# Patient Record
Sex: Male | Born: 1995 | Race: White | Hispanic: No | Marital: Single | Smoking: Former smoker
Health system: Southern US, Community
[De-identification: ages and names within clinical notes are randomized; demographics above are authoritative.]

---

## 2018-03-01 ENCOUNTER — Encounter (HOSPITAL_COMMUNITY): Payer: Self-pay

## 2018-03-01 ENCOUNTER — Emergency Department (HOSPITAL_COMMUNITY)
Admission: EM | Admit: 2018-03-01 | Discharge: 2018-03-01 | Disposition: A | Discharge status: Home / Self Care | Payer: BC Managed Care – PPO | Attending: Emergency Medicine | Admitting: Emergency Medicine

## 2018-03-01 ENCOUNTER — Encounter (HOSPITAL_COMMUNITY): Payer: Self-pay | Admitting: Family Medicine

## 2018-03-01 ENCOUNTER — Ambulatory Visit (INDEPENDENT_AMBULATORY_CARE_PROVIDER_SITE_OTHER)
Admission: EM | Admit: 2018-03-01 | Discharge: 2018-03-01 | Disposition: A | Discharge status: Home / Self Care | Payer: BC Managed Care – PPO | Source: Home / Self Care | Attending: Family Medicine | Admitting: Family Medicine

## 2018-03-01 ENCOUNTER — Other Ambulatory Visit: Payer: Self-pay

## 2018-03-01 DIAGNOSIS — Z87891 Personal history of nicotine dependence: Secondary | ICD-10-CM | POA: Diagnosis not present

## 2018-03-01 DIAGNOSIS — R1031 Right lower quadrant pain: Secondary | ICD-10-CM

## 2018-03-01 DIAGNOSIS — R1032 Left lower quadrant pain: Secondary | ICD-10-CM

## 2018-03-01 DIAGNOSIS — R197 Diarrhea, unspecified: Secondary | ICD-10-CM | POA: Diagnosis not present

## 2018-03-01 DIAGNOSIS — R112 Nausea with vomiting, unspecified: Secondary | ICD-10-CM | POA: Diagnosis not present

## 2018-03-01 LAB — CBC
HCT: 49.4 % (ref 39.0–52.0)
Hemoglobin: 17.4 g/dL — ABNORMAL HIGH (ref 13.0–17.0)
MCH: 30.2 pg (ref 26.0–34.0)
MCHC: 35.2 g/dL (ref 30.0–36.0)
MCV: 85.8 fL (ref 80.0–100.0)
PLATELETS: 261 10*3/uL (ref 150–400)
RBC: 5.76 MIL/uL (ref 4.22–5.81)
RDW: 12.2 % (ref 11.5–15.5)
WBC: 5.9 10*3/uL (ref 4.0–10.5)
nRBC: 0 % (ref 0.0–0.2)

## 2018-03-01 LAB — COMPREHENSIVE METABOLIC PANEL
ALT: 14 U/L (ref 0–44)
AST: 24 U/L (ref 15–41)
Albumin: 4.2 g/dL (ref 3.5–5.0)
Alkaline Phosphatase: 53 U/L (ref 38–126)
Anion gap: 10 (ref 5–15)
BUN: 16 mg/dL (ref 6–20)
CO2: 21 mmol/L — ABNORMAL LOW (ref 22–32)
Calcium: 9 mg/dL (ref 8.9–10.3)
Chloride: 102 mmol/L (ref 98–111)
Creatinine, Ser: 1.05 mg/dL (ref 0.61–1.24)
GFR calc non Af Amer: >60 mL/min (ref >60)
Glucose, Bld: 97 mg/dL (ref 70–99)
Potassium: 4.5 mmol/L (ref 3.5–5.1)
SODIUM: 133 mmol/L — AB (ref 135–145)
Total Bilirubin: 1.5 mg/dL — ABNORMAL HIGH (ref 0.3–1.2)
Total Protein: 6.6 g/dL (ref 6.5–8.1)

## 2018-03-01 LAB — URINALYSIS, ROUTINE W REFLEX MICROSCOPIC
Bilirubin Urine: NEGATIVE
GLUCOSE, UA: NEGATIVE mg/dL
Hgb urine dipstick: NEGATIVE
Ketones, ur: NEGATIVE mg/dL
LEUKOCYTE UA: NEGATIVE
Nitrite: NEGATIVE
PROTEIN: NEGATIVE mg/dL
Specific Gravity, Urine: 1.016 (ref 1.005–1.030)
pH: 6 (ref 5.0–8.0)

## 2018-03-01 LAB — LIPASE, BLOOD: Lipase: 21 U/L (ref 11–51)

## 2018-03-01 MED ORDER — ONDANSETRON HCL 4 MG/2ML IJ SOLN
4.0000 mg | Freq: Once | INTRAMUSCULAR | Status: AC
  Administered 2018-03-01: 4 mg via INTRAVENOUS
  Filled 2018-03-01: qty 2

## 2018-03-01 MED ORDER — ONDANSETRON 4 MG PO TBDP: 4.0000 mg | ORAL_TABLET | Freq: Three times a day (TID) | ORAL | 0 refills | Status: AC | PRN

## 2018-03-01 MED ORDER — SODIUM CHLORIDE 0.9 % IV BOLUS
1000.0000 mL | Freq: Once | INTRAVENOUS | Status: AC
  Administered 2018-03-01: 1000 mL via INTRAVENOUS

## 2018-03-01 NOTE — ED Triage Notes (Signed)
Pt endorses mid abd pain with n/v/d x 4 days. Has not kept anything down in 3 days. VSS.

## 2018-03-01 NOTE — Discharge Instructions (Addendum)
You have been seen today for nausea, vomiting, and diarrhea. Please read and follow all provided instructions.   1. Medications: zofran for nausea, usual home medications 2. Treatment: rest, drink plenty of fluids 3. Follow Up: Please follow up with your primary doctor in 2 days for discussion of your diagnoses and further evaluation after today's visit; if you do not have a primary care doctor use the resource guide provided to find one; Please return to the ER for any new or worsening symptoms. Please obtain all of your results from medical records or have your doctors office obtain the results - share them with your doctor - you should be seen at your doctors office. Call today to arrange your follow up.   Take medications as prescribed. Please review all of the medicines and only take them if you do not have an allergy to them. Return to the emergency room for worsening condition or new concerning symptoms. Follow up with your regular doctor. If you don't have a regular doctor use one of the numbers below to establish a primary care doctor.  Please be aware that if you are taking birth control pills, taking other prescriptions, ESPECIALLY ANTIBIOTICS may make the birth control ineffective - if this is the case, either do not engage in sexual activity or use alternative methods of birth control such as condoms until you have finished the medicine and your family doctor says it is OK to restart them. If you are on a blood thinner such as COUMADIN, be aware that any other medicine that you take may cause the coumadin to either work too much, or not enough - you should have your coumadin level rechecked in next 7 days if this is the case.  ?  It is also a possibility that you have an allergic reaction to any of the medicines that you have been prescribed - Everybody reacts differently to medications and while MOST people have no trouble with most medicines, you may have a reaction such as nausea, vomiting,  rash, swelling, shortness of breath. If this is the case, please stop taking the medicine immediately and contact your physician.  ?  You should return to the ER if you develop severe or worsening symptoms.   Emergency Department Resource Guide 1) Find a Doctor and Pay Out of Pocket Although you won't have to find out who is covered by your insurance plan, it is a good idea to ask around and get recommendations. You will then need to call the office and see if the doctor you have chosen will accept you as a new patient and what types of options they offer for patients who are self-pay. Some doctors offer discounts or will set up payment plans for their patients who do not have insurance, but you will need to ask so you aren't surprised when you get to your appointment.  2) Contact Your Local Health Department Not all health departments have doctors that can see patients for sick visits, but many do, so it is worth a call to see if yours does. If you don't know where your local health department is, you can check in your phone book. The CDC also has a tool to help you locate your state's health department, and many state websites also have listings of all of their local health departments.  3) Find a Walk-in Clinic If your illness is not likely to be very severe or complicated, you may want to try a walk in clinic. These  are popping up all over the country in pharmacies, drugstores, and shopping centers. They're usually staffed by nurse practitioners or physician assistants that have been trained to treat common illnesses and complaints. They're usually fairly quick and inexpensive. However, if you have serious medical issues or chronic medical problems, these are probably not your best option.  No Primary Care Doctor: Call Health Connect at  [phone] - they can help you locate a primary care doctor that  accepts your insurance, provides certain services, etc. Physician Referral [phone]  Emergency Department Resource Guide 1) Find a Doctor and Pay Out of Pocket Although you won't have to find out who is covered by your insurance plan, it is a good idea to ask around and get recommendations. You will then need to call the office and see if the doctor you have chosen will accept you as a new patient and what types of options they offer for patients who are self-pay. Some doctors offer discounts or will set up payment plans for their patients who do not have insurance, but you will need to ask so you aren't surprised when you get to your appointment.  2) Contact Your Local Health Department Not all health departments have doctors that can see patients for sick visits, but many do, so it is worth a call to see if yours does. If you don't know where your local health department is, you can check in your phone book. The CDC also has a tool to help you locate your state's health department, and many state websites also have listings of all of their local health departments.  3) Find a [facility] If your illness is not likely to be very severe or complicated, you may want to try a walk in clinic. These are popping up all over the country in pharmacies, drugstores, and shopping centers. They're usually staffed by nurse practitioners or physician assistants that have been trained to treat common illnesses and complaints. They're usually fairly quick and inexpensive. However, if you have serious medical issues or chronic medical problems, these are probably not your best option.  No Primary Care Doctor: Call Health Connect at  [phone] - they can help you locate a primary care doctor that  accepts your insurance, provides certain services, etc. Physician Referral Service- [phone]  Chronic Pain Problems: Organization         Address  Phone   Notes  [facility]  [phone] Patients need to be referred by their primary care doctor.    Medication Assistance: Organization         Address  Phone   Notes  [facility] Medication [facility] [address] [place], [place] [address] [phone] --Must be a resident of [facility] -- Must have NO insurance coverage whatsoever (no Medicaid/ Medicare, etc.) -- The pt. MUST have a primary care doctor that directs their care regularly and follows them in the community   [facility]  [phone]   [employer]  [phone]    Agencies that provide inexpensive medical care: Organization         Address  Phone   Notes  [place]  [phone]   [facility]    [phone]   [facility] [place],  [phone]   [place] [address] [place] [phone]   [facility]anned [facility]    (  [phone]   [facility]    [phone]   [facility]  [address], [place] Phone:  [phone], Fax:  [phone] Hours of Operation:  9 am - 6 pm, M-F.  Also accepts Medicaid/Medicare and self-pay.  [facility] for [place] [address], [address], [place] Phone: [phone], Fax: [phone]. Hours of Operation:  8:30 am - 5:30 pm, M-F.  Also accepts Medicaid and self-pay.  [facility] [place] [address], [place] Phone: [phone]   [place], [place], [place] [phone], Ext. [phone] Mondays & Thursdays: 7-9 AM.  First 15 patients are seen on a first come, first serve basis.    [place] Providers:  Organization         Address  Phone   Notes  [facility] [address], [place] [phone] Also accepts self-pay patients.  [facility] [address], [place]  [phone]   [place], [address], [place] [phone]   [facility] [address] [place] [phone]   Lucianne Lei [address], [place]   [phone] Only accepts [place] [facility] [place] patients after they have their name applied to their card.   Self-Pay (no insurance) in [facility]:  Organization         Address  Phone   Notes  Sickle Cell Patients, [facility] [place] [phone]   [facility] [place] [phone]   [facility] [place]  [place], [place], [place] [phone]   [facility]/Dr. Osei-Bonsu  [address], [place] or [address], [place] [phone] Phone number for both [place] and [place] locations is the same.  [facility] and [facility] [address], [place] [phone]   [facility] [address] [place] or [address] [phone]   [facility] [address] [place] [phone], phone; [phone], fax Sees patients 1st and 3rd Saturday of every month.  Must not qualify for public or private insurance (i.e. Medicaid, Medicare, [place] Health Choice, Veterans' Benefits)  Household income should be no more than 200% of the poverty level The clinic cannot treat you if you are pregnant or think you are pregnant  Sexually transmitted diseases are not treated at the clinic.

## 2018-03-01 NOTE — ED Provider Notes (Signed)
[facility]    CSN: [id number] Arrival date & time: 03/01/18  3557     History   Chief Complaint Chief Complaint  Patient presents with  . Abdominal Pain    HPI Adrian Clements is a 23 y.o. male.   Patient is a 23 year old male the presents with 4 days of persistent abdominal pain.  Describes as sharp and cramping at times.  The pain is mostly in the peri-umbilicus and right lower quadrant area.  He has had associated vomiting and diarrhea.  Denies any fever, chills, myalgias.  He has had loss of appetite.  No recent traveling.  No recent sick contacts. LBM PTA.   ROS per HPI      History reviewed. No pertinent past medical history.  There are no active problems to display for this patient.   History reviewed. No pertinent surgical history.     Home Medications    Prior to Admission medications   Not on File    Family History History reviewed. No pertinent family history.  Social History Social History   Tobacco Use  . Smoking status: Former Games developer  . Smokeless tobacco: Never Used  Substance Use Topics  . Alcohol use: Never    Frequency: Never  . Drug use: Yes    Types: Marijuana     Allergies   Patient has no known allergies.   Review of Systems Review of Systems   Physical Exam Triage Vital Signs ED Triage Vitals [03/01/18 0917]  Enc Vitals Group     BP 106/80     Pulse Rate 88     Resp 16     Temp 97.9 F (36.6 C)     Temp Source Oral     SpO2 100 %     Weight      Height      Head Circumference      Peak Flow      Pain Score      Pain Loc      Pain Edu?      Excl. in GC?    No data found.  Updated Vital Signs BP 106/80 (BP Location: Left Arm)   Pulse 88   Temp 97.9 F (36.6 C) (Oral)   Resp 16   SpO2 100%   Visual Acuity Right Eye Distance:   Left Eye Distance:   Bilateral Distance:    Right Eye Near:   Left Eye Near:    Bilateral Near:     Physical Exam Constitutional:      General: He is not in  acute distress.    Appearance: He is not ill-appearing, toxic-appearing or diaphoretic.  HENT:     Head: Normocephalic and atraumatic.  Pulmonary:     Effort: Pulmonary effort is normal.  Abdominal:     General: Bowel sounds are normal.     Palpations: Abdomen is soft.     Tenderness: There is abdominal tenderness in the right lower quadrant and periumbilical area. There is no rebound.     Hernia: No hernia is present.  Skin:    General: Skin is warm and dry.  Neurological:     Mental Status: He is alert.  Psychiatric:        Mood and Affect: Mood normal.      UC Treatments / Results  Labs (all labs ordered are listed, but only abnormal results are displayed) Labs Reviewed - No data to display  EKG None  Radiology No results found.  Procedures  Procedures (including critical care time)  Medications Ordered in UC Medications - No data to display  Initial Impression / Assessment and Plan / UC Course  I have reviewed the triage vital signs and the nursing notes.  Pertinent labs & imaging results that were available during my care of the patient were reviewed by me and considered in my medical decision making (see chart for details).     Patient with right lower quadrant and periumbilical tenderness Symptoms of been constant over 4 days. He has had associated nausea, vomiting and diarrhea We will send him to the ER to rule out appendicitis  Final Clinical Impressions(s) / UC Diagnoses   Final diagnoses:  Right lower quadrant abdominal pain     Discharge Instructions     Please go to the ER for further evaluation of your right lower quadrant pain    ED Prescriptions    None     Controlled Substance Prescriptions [place] Controlled Substance Registry consulted? Not Applicable   Janace ArisBast, Ulanda Tackett A, NP 03/01/18 1001

## 2018-03-01 NOTE — Discharge Instructions (Addendum)
Please go to the ER for further evaluation of your right lower quadrant pain

## 2018-03-01 NOTE — ED Provider Notes (Signed)
[facility] EMERGENCY DEPARTMENT Provider Note   CSN: [id number] Arrival date & time: 03/01/18  1020   History   Chief Complaint Chief Complaint  Patient presents with  . Emesis  . Diarrhea  . Abdominal Pain    HPI Adrian Clements is a 23 y.o. male with no significant past medical history presenting with intermittent yellow non bloody emesis, brown non bloody diarrhea, and LLQ abdominal pain onset 4 days ago. Patient reports 3 episodes of vomiting during the last 24 hours and states last one was at 8am. Patient reports multiple episodes of diarrhea and states the last one was at 9am. Patient states eating makes symptoms worse and nothing makes symptoms better. Patient describes abdominal pain as an ache. Patient denies alcohol, tobacco, or drug use. Patient denies any abdominal surgeries. Patient denies evaluation by GI in the past. Patient denies fever, chills, or recent travel. Patient denies trying new foods. Patient reports congestion and rhinorrhea onset 2 days ago. Patient denies dysuria or frequency. Patient denies chest pain or shortness of breath. Patient denies sick contacts.   HPI  History reviewed. No pertinent past medical history.  There are no active problems to display for this patient.   History reviewed. No pertinent surgical history.      Home Medications    Prior to Admission medications   Medication Sig Start Date End Date Taking? Authorizing Provider  ondansetron (ZOFRAN ODT) 4 MG disintegrating tablet Take 1 tablet (4 mg total) by mouth every 8 (eight) hours as needed for nausea or vomiting. 03/01/18   Leretha Dykes, PA-C    Family History History reviewed. No pertinent family history.  Social History Social History   Tobacco Use  . Smoking status: Former Games developer  . Smokeless tobacco: Never Used  Substance Use Topics  . Alcohol use: Never    Frequency: Never  . Drug use: Yes    Types: Marijuana     Allergies   Patient has  no known allergies.   Review of Systems Review of Systems  Constitutional: Positive for appetite change. Negative for activity change, chills, diaphoresis, fatigue, fever and unexpected weight change.  HENT: Positive for congestion and rhinorrhea. Negative for sore throat.   Eyes: Negative for visual disturbance.  Respiratory: Negative for cough and shortness of breath.   Cardiovascular: Negative for chest pain.  Gastrointestinal: Positive for abdominal pain, diarrhea, nausea and vomiting. Negative for constipation.  Endocrine: Negative for polydipsia, polyphagia and polyuria.  Genitourinary: Negative for discharge, dysuria, flank pain, frequency and testicular pain.  Musculoskeletal: Negative for back pain.  Skin: Negative for rash.  Allergic/Immunologic: Negative for immunocompromised state.  Psychiatric/Behavioral: The patient is not nervous/anxious.      Physical Exam Updated Vital Signs BP 107/67 (BP Location: Right Arm)   Pulse 88   Temp 98 F (36.7 C) (Oral)   Resp 14   SpO2 100%   Physical Exam Vitals signs and nursing note reviewed.  Constitutional:      General: He is not in acute distress.    Appearance: He is well-developed. He is not diaphoretic.  HENT:     Head: Normocephalic and atraumatic.     Nose: Congestion and rhinorrhea present.     Mouth/Throat:     Mouth: Mucous membranes are moist.     Pharynx: No pharyngeal swelling or oropharyngeal exudate.  Eyes:     General: No scleral icterus.    Extraocular Movements: Extraocular movements intact.     Pupils: Pupils are  equal, round, and reactive to light.  Neck:     Musculoskeletal: Normal range of motion and neck supple.  Cardiovascular:     Rate and Rhythm: Normal rate and regular rhythm.     Heart sounds: Normal heart sounds. No murmur. No friction rub. No gallop.   Pulmonary:     Effort: Pulmonary effort is normal. No respiratory distress.     Breath sounds: Normal breath sounds. No wheezing or  rales.  Abdominal:     General: Abdomen is flat. Bowel sounds are increased. There is no distension.     Palpations: Abdomen is soft. Abdomen is not rigid. There is no mass.     Tenderness: There is abdominal tenderness in the left lower quadrant. There is no right CVA tenderness, left CVA tenderness, guarding or rebound. Negative signs include Murphy's sign, Rovsing's sign and McBurney's sign.     Hernia: No hernia is present.  Musculoskeletal: Normal range of motion.  Skin:    Findings: No rash.  Neurological:     Mental Status: He is alert and oriented to person, place, and time.      ED Treatments / Results  Labs (all labs ordered are listed, but only abnormal results are displayed) Labs Reviewed  COMPREHENSIVE METABOLIC PANEL - Abnormal; Notable for the following components:      Result Value   Sodium 133 (*)    CO2 21 (*)    Total Bilirubin 1.5 (*)    All other components within normal limits  CBC - Abnormal; Notable for the following components:   Hemoglobin 17.4 (*)    All other components within normal limits  LIPASE, BLOOD  URINALYSIS, ROUTINE W REFLEX MICROSCOPIC    EKG None  Radiology No results found.  Procedures Procedures (including critical care time)  Medications Ordered in ED Medications  ondansetron (ZOFRAN) injection 4 mg (4 mg Intravenous Given 03/01/18 1115)  sodium chloride 0.9 % bolus 1,000 mL (0 mLs Intravenous Stopped 03/01/18 1216)     Initial Impression / Assessment and Plan / ED Course  I have reviewed the triage vital signs and the nursing notes.  Pertinent labs & imaging results that were available during my care of the patient were reviewed by me and considered in my medical decision making (see chart for details).    Patient is nontoxic, nonseptic appearing, in no apparent distress.  Patient's pain and other symptoms adequately managed in emergency department.  Fluid bolus given.  Labs, imaging and vitals reviewed.  Patient does not  meet the SIRS or Sepsis criteria.  On repeat exam patient does not have a surgical abdomin and there are no peritoneal signs.  No indication of appendicitis, bowel obstruction, bowel perforation, cholecystitis, diverticulitis. Symptoms have been controlled while in the ER. Patient denies any nausea, vomiting, or diarrhea while in the ER. Patient reports improvement. Suspect symptoms are viral in nature based on history, physical exam, and work up. Patient discharged home with symptomatic treatment and given strict instructions for follow-up with their primary care physician.  I have also discussed reasons to return immediately to the ER.  Patient expresses understanding and agrees with plan.  Final Clinical Impressions(s) / ED Diagnoses   Final diagnoses:  Nausea vomiting and diarrhea  Abdominal pain, LLQ    ED Discharge Orders         Ordered    ondansetron (ZOFRAN ODT) 4 MG disintegrating tablet  Every 8 hours PRN     03/01/18 1337  Leretha DykesHernandez, Dareion Kneece P, [place]PA-C 03/01/18 1339    Benjiman CorePickering, Nathan, MD 03/01/18 1640

## 2018-03-01 NOTE — ED Triage Notes (Signed)
PT C/O: constant abd pain onset 4 days associated w/n/v/d, cold sweates, headaches  DENIES: fevers, abn wt loss  TAKING MEDS: none  A&O x4... NAD... Ambulatory

## 2019-03-20 ENCOUNTER — Other Ambulatory Visit: Payer: Self-pay | Admitting: Colon & Rectal Surgery

## 2019-03-20 ENCOUNTER — Other Ambulatory Visit: Payer: Self-pay | Admitting: Orthopedic Surgery

## 2019-03-20 DIAGNOSIS — S36039D Unspecified laceration of spleen, subsequent encounter: Secondary | ICD-10-CM

## 2019-03-27 ENCOUNTER — Ambulatory Visit
Admission: RE | Admit: 2019-03-27 | Discharge: 2019-03-27 | Disposition: A | Discharge status: Home / Self Care | Payer: BC Managed Care – PPO | Source: Ambulatory Visit | Attending: Colon & Rectal Surgery | Admitting: Colon & Rectal Surgery

## 2019-03-27 DIAGNOSIS — S36039D Unspecified laceration of spleen, subsequent encounter: Secondary | ICD-10-CM
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
